# Patient Record
Sex: Female | Born: 1980
Health system: Southern US, Community
[De-identification: ages and names within clinical notes are randomized; demographics above are authoritative.]

## PROBLEM LIST (undated history)

## (undated) DIAGNOSIS — E079 Disorder of thyroid, unspecified: Secondary | ICD-10-CM

## (undated) HISTORY — DX: Disorder of thyroid, unspecified: E07.9

---

## 2005-05-20 ENCOUNTER — Other Ambulatory Visit: Admission: RE | Admit: 2005-05-20 | Discharge: 2005-05-20 | Payer: Self-pay | Admitting: Obstetrics and Gynecology

## 2016-03-10 ENCOUNTER — Ambulatory Visit (INDEPENDENT_AMBULATORY_CARE_PROVIDER_SITE_OTHER): Payer: BLUE CROSS/BLUE SHIELD | Admitting: Podiatry

## 2016-03-10 ENCOUNTER — Ambulatory Visit (INDEPENDENT_AMBULATORY_CARE_PROVIDER_SITE_OTHER): Payer: BLUE CROSS/BLUE SHIELD

## 2016-03-10 ENCOUNTER — Encounter: Payer: Self-pay | Admitting: Podiatry

## 2016-03-10 DIAGNOSIS — R52 Pain, unspecified: Secondary | ICD-10-CM

## 2016-03-10 DIAGNOSIS — M722 Plantar fascial fibromatosis: Secondary | ICD-10-CM

## 2016-03-10 MED ORDER — MELOXICAM 15 MG PO TABS: 15.0000 mg | ORAL_TABLET | Freq: Every day | ORAL | 2 refills | Status: DC

## 2016-03-10 NOTE — Progress Notes (Signed)
   Subjective:    Patient ID: Carrie Taylor, female    DOB: [birth year], 35 y.o.   MRN: [id number]  HPI  35 year old female presents the office they for concerns of right heel pain which is been ongoing for about 2 weeks. She states that she has pain in the morning and she prescribed up after sitting for some time but she is getting some pain in the day and with exercising. She states it hurts on the bottom of the heel describes as a throbbing sensation. No recent injury or trauma. No swelling or redness. No numbness or tingling. Pain does not wake up at night. No other complaints at this time. No treatment.    Review of Systems  All other systems reviewed and are negative.      Objective:   Physical Exam General: AAO x3, NAD  Dermatological: Skin is warm, dry and supple bilateral. Nails x 10 are well manicured; remaining integument appears unremarkable at this time. There are no open sores, no preulcerative lesions, no rash or signs of infection present.  Vascular: Dorsalis Pedis artery and Posterior Tibial artery pedal pulses are 2/4 bilateral with immedate capillary fill time. There is no pain with calf compression, swelling, warmth, erythema.   Neruologic: Grossly intact via Taylor touch bilateral. Vibratory intact via tuning fork bilateral. Protective threshold with Semmes Wienstein monofilament intact to all pedal sites bilateral.    Musculoskeletal: Tenderness to palpation along the plantar medial tubercle of the calcaneus at the insertion of plantar fascia on the right foot. There is no pain along the course of the plantar fascia within the arch of the foot. Plantar fascia appears to be intact. There is no pain with lateral compression of the calcaneus or pain with vibratory sensation. There is mild pain along the course or insertion of the achilles tendon. Thompson test is negative. No other areas of tenderness to bilateral lower extremities. Muscular strength 5/5 in all groups tested  bilateral.  Gait: Unassisted, Nonantalgic.     Assessment & Plan:  35 year old female right heel pain, likely plantar fasciitis with Achilles tendinitis. -Treatment options discussed including all alternatives, risks, and complications -Etiology of symptoms were discussed -X-rays were obtained and reviewed with the patient. No evidence of acute fracture identified. -Patient elects to proceed with steroid injection into the right heel. Under sterile skin preparation, a total of 2.5cc of kenalog 10, 0.5% Marcaine plain, and 2% lidocaine plain were infiltrated into the symptomatic area without complication. A band-aid was applied. Patient tolerated the injection well without complication. Post-injection care with discussed with the patient. Discussed with the patient to ice the area over the next couple of days to help prevent a steroid flare.  -Plantar fascial brace dispensed -Prescribed mobic. Discussed side effects of the medication and directed to stop if any are to occur and call the office.  -Stretching and icing exercises daily -Discussed shoe gear modifications and orthotics -Follow-up in 3 weeks or sooner if any problems arise. In the meantime, encouraged to call the office with any questions, concerns, change in symptoms.   Ovid CurdMatthew Wagoner, DPM

## 2016-03-10 NOTE — Patient Instructions (Signed)

## 2016-03-31 ENCOUNTER — Encounter: Payer: Self-pay | Admitting: Podiatry

## 2016-03-31 ENCOUNTER — Ambulatory Visit (INDEPENDENT_AMBULATORY_CARE_PROVIDER_SITE_OTHER): Payer: BLUE CROSS/BLUE SHIELD | Admitting: Podiatry

## 2016-03-31 DIAGNOSIS — M722 Plantar fascial fibromatosis: Secondary | ICD-10-CM

## 2016-03-31 NOTE — Progress Notes (Signed)
Subjective: Carrie Taylor presents to the office today for follow-up evaluation of right heel pain. At that she is doing better but she still having pain in her right heel and the morning when she gets up and after exercise. Denies any swelling or redness. She has been wearing the plantar fascial brace. She has been stretching and icing. No numbness or tingling. No other complaints at this time. No acute changes since last appointment. They deny any systemic complaints such as fevers, chills, nausea, vomiting.  Objective: General: AAO x3, NAD  Dermatological: Skin is warm, dry and supple bilateral. Nails x 10 are well manicured; remaining integument appears unremarkable at this time. There are no open sores, no preulcerative lesions, no rash or signs of infection present.  Vascular: Dorsalis Pedis artery and Posterior Tibial artery pedal pulses are 2/4 bilateral with immedate capillary fill time. Pedal hair growth present. There is no pain with calf compression, swelling, warmth, erythema.   Neruologic: Grossly intact via Taylor touch bilateral. Vibratory intact via tuning fork bilateral. Protective threshold with Semmes Wienstein monofilament intact to all pedal sites bilateral.   Musculoskeletal: There is improved but continued tenderness palpation along the plantar medial tubercle of the calcaneus at the insertion of the plantar fascia on the right foot. There is no pain along the course of the plantar fascia within the arch of the foot. Plantar fascia appears to be intact bilaterally. There is no pain with lateral compression of the calcaneus and there is no pain with vibratory sensation. There is no pain along the course or insertion of the Achilles tendon. There are no other areas of tenderness to bilateral lower extremities. No gross boney pedal deformities bilateral. No pain, crepitus, or limitation noted with foot and ankle range of motion bilateral. Muscular strength 5/5 in all groups tested  bilateral.  Gait: Unassisted, Nonantalgic.   Assessment: Presents for follow-up evaluation for heel pain, likely plantar fasciitis   Plan: -Treatment options discussed including all alternatives, risks, and complications -Patient elects to proceed with steroid injection into the right heel. Under sterile skin preparation, a total of 2.5cc of kenalog 10, 0.5% Marcaine plain, and 2% lidocaine plain were infiltrated into the symptomatic area without complication. A band-aid was applied. Patient tolerated the injection well without complication. Post-injection care with discussed with the patient. Discussed with the patient to ice the area over the next couple of days to help prevent a steroid flare.  -Discussed orthotics. She is going to start with an over-the-counter insert. -Dispensed night splint -Ice and stretching exercises on a daily basis. -Continue supportive shoe gear. -Follow-up in 3 weeks or sooner if any problems arise. In the meantime, encouraged to call the office with any questions, concerns, change in symptoms.   Ovid CurdMatthew Ryelle Ruvalcaba, DPM

## 2016-04-11 ENCOUNTER — Telehealth: Payer: Self-pay | Admitting: *Deleted

## 2016-04-11 MED ORDER — MELOXICAM 15 MG PO TABS: 15.0000 mg | ORAL_TABLET | Freq: Every day | ORAL | 0 refills | Status: DC

## 2016-04-11 NOTE — Telephone Encounter (Signed)
Received request for #90 supply of Meloxicam.  Dr. Bary CastillaWagoner okayed the prescription to be changed to #90 without refills. Return faxed to [phone].

## 2016-04-28 ENCOUNTER — Ambulatory Visit: Payer: BLUE CROSS/BLUE SHIELD | Admitting: Podiatry

## 2016-07-30 ENCOUNTER — Other Ambulatory Visit: Payer: Self-pay | Admitting: Podiatry

## 2016-08-01 NOTE — Telephone Encounter (Signed)
Pt needs a follow up appt, prior to future refills. 

## 2016-08-09 ENCOUNTER — Ambulatory Visit (INDEPENDENT_AMBULATORY_CARE_PROVIDER_SITE_OTHER): Payer: BLUE CROSS/BLUE SHIELD | Admitting: Podiatry

## 2016-08-09 DIAGNOSIS — M79671 Pain in right foot: Secondary | ICD-10-CM

## 2016-08-09 DIAGNOSIS — M722 Plantar fascial fibromatosis: Secondary | ICD-10-CM

## 2016-08-09 MED ORDER — METHYLPREDNISOLONE 4 MG PO TBPK: ORAL_TABLET | ORAL | 0 refills | Status: DC

## 2016-08-09 MED ORDER — MELOXICAM 15 MG PO TABS: 15.0000 mg | ORAL_TABLET | Freq: Every day | ORAL | 1 refills | Status: AC

## 2016-08-09 NOTE — Progress Notes (Signed)
   Subjective: Patient presents today for recurrent plantar fasciitis to the right foot 2 weeks. Patient states that she had a flareup approximately 2 weeks ago. She denies change in daily activities. Patient exercises on a regular basis. Patient states that is painful to walk. Patient was last seen 03/31/2016 under the care of Dr. Jacqualyn Posey for plantar fasciitis to the same right foot. With this latest injury, the patient notices a skin crease in the bottom of her right heel.  Objective: Physical Exam General: The patient is alert and oriented x3 in no acute distress.  Dermatology: Skin is warm, dry and supple bilateral lower extremities. Negative for open lesions or macerations bilateral.   Vascular: Dorsalis Pedis and Posterior Tibial pulses palpable bilateral.  Capillary fill time is immediate to all digits.  Neurological: Epicritic and protective threshold intact bilateral.   Musculoskeletal: Tenderness to palpation at the medial calcaneal tubercale and through the insertion of the plantar fascia of the right foot. All other joints range of motion within normal limits bilateral. Strength 5/5 in all groups bilateral.    Assessment: 1. Plantar fasciitis right 2. Pain in right foot  Plan of Care:  1. Patient evaluated. Xrays reviewed.   2. Patient opts not to have an anti-inflammatory injection. She states that her insurance will not pay for it until her deductible is met. 3. Prescription for Medrol Dosepak 4. Prescription for meloxicam 5. Return to clinic in 4 weeks 6. Continue plantar fascial band in good supportive shoe gear and stretching.  Edrick Kins, DPM [facility]  Dr. Edrick Kins, [address]                                        [place], [place] [address]                Office [phone]  Fax [phone]

## 2016-09-06 ENCOUNTER — Ambulatory Visit: Payer: BLUE CROSS/BLUE SHIELD | Admitting: Podiatry

## 2016-09-27 ENCOUNTER — Ambulatory Visit (INDEPENDENT_AMBULATORY_CARE_PROVIDER_SITE_OTHER): Payer: BLUE CROSS/BLUE SHIELD | Admitting: Podiatry

## 2016-09-27 DIAGNOSIS — S93691D Other sprain of right foot, subsequent encounter: Secondary | ICD-10-CM

## 2016-09-27 DIAGNOSIS — M722 Plantar fascial fibromatosis: Secondary | ICD-10-CM

## 2016-09-27 DIAGNOSIS — M79671 Pain in right foot: Secondary | ICD-10-CM | POA: Diagnosis not present

## 2016-09-27 NOTE — Progress Notes (Signed)
   Subjective: 36 year old female presents today for follow-up evaluation of plantar fasciitis with possible plantar fascial rupture of the right foot. Patient states that she is improving in the Medrol Dosepak helped. However she still has some residual pain. She also states that the skin crease in the bottom of her right heel has done or prominent larger. Patient has had a history of this plantar fasciitis for greater than 6 months now with increased aggravation on the latest episode. Patient does active CrossFit  Objective: Physical Exam General: The patient is alert and oriented x3 in no acute distress.  Dermatology: Skin is warm, dry and supple bilateral lower extremities. Negative for open lesions or macerations bilateral.   Vascular: Dorsalis Pedis and Posterior Tibial pulses palpable bilateral.  Capillary fill time is immediate to all digits.  Neurological: Epicritic and protective threshold intact bilateral.   Musculoskeletal: Tenderness to palpation at the medial calcaneal tubercale and through the insertion of the plantar fascia of the right foot. All other joints range of motion within normal limits bilateral. Strength 5/5 in all groups bilateral.    Assessment: 1. Plantar fasciitis right 2. Possible plantar fascial rupture right heel  Plan of Care:  1. Patient evaluated. Xrays reviewed.   2. Today were going to order an MRI for possible plantar fascial rupture to determine if surgical intervention is warranted. Patient also has the history of plantar fasciitis for greater than 6 months. Patient has been dealing with the plantar fasciitis since fall of last year. 3. Compression anklet dispensed 4. Today were going to immobilize the right foot in a cam boot. Cam boot dispensed 5. Return to clinic in 4 weeks   Felecia ShellingBrent M. Eldra Word, DPM [facility]  Dr. Felecia ShellingBrent M. Kimanh Templeman, DPM    [address]                                        [place], [place] [address]                 Office [phone]  Fax [phone]

## 2016-09-29 ENCOUNTER — Telehealth: Payer: Self-pay

## 2016-09-29 NOTE — Addendum Note (Signed)
Addended by: Geraldine ContrasVENABLE, ANGELA D on: 09/29/2016 09:18 AM   Modules accepted: Orders

## 2016-09-29 NOTE — Telephone Encounter (Signed)
Prior Authorization approved per Inetta Fermoina with [employer] ([employer])  Auth # [id number]  Good until 10/14/16.  Radiology scheduling has been notified and they will call patient to schedule.

## 2016-10-07 ENCOUNTER — Encounter: Payer: Self-pay | Admitting: Radiology

## 2016-10-07 ENCOUNTER — Ambulatory Visit
Admission: RE | Admit: 2016-10-07 | Discharge: 2016-10-07 | Disposition: A | Discharge status: Home / Self Care | Payer: BLUE CROSS/BLUE SHIELD | Source: Ambulatory Visit | Attending: Podiatry | Admitting: Podiatry

## 2016-10-07 DIAGNOSIS — M67471 Ganglion, right ankle and foot: Secondary | ICD-10-CM | POA: Insufficient documentation

## 2016-10-07 DIAGNOSIS — X58XXXD Exposure to other specified factors, subsequent encounter: Secondary | ICD-10-CM | POA: Insufficient documentation

## 2016-10-07 DIAGNOSIS — S93691D Other sprain of right foot, subsequent encounter: Secondary | ICD-10-CM | POA: Diagnosis not present

## 2016-10-13 ENCOUNTER — Telehealth: Payer: Self-pay | Admitting: *Deleted

## 2016-10-13 NOTE — Telephone Encounter (Signed)
Pt called to see if MRI results were in. I reviewed the results and informed pt the results were in and she should remain in the walking boot, and transferred to see if she could get an earlier appt.

## 2016-10-14 NOTE — Telephone Encounter (Signed)
Perfect. Thanks, Dr. Evans

## 2016-10-21 ENCOUNTER — Ambulatory Visit (INDEPENDENT_AMBULATORY_CARE_PROVIDER_SITE_OTHER): Payer: BLUE CROSS/BLUE SHIELD | Admitting: Podiatry

## 2016-10-21 DIAGNOSIS — S93691D Other sprain of right foot, subsequent encounter: Secondary | ICD-10-CM

## 2016-10-21 DIAGNOSIS — M79671 Pain in right foot: Secondary | ICD-10-CM

## 2016-10-23 NOTE — Progress Notes (Signed)
   Subjective: 36 year old female presents today for follow-up evaluation of right foot pain. Patient states that she has a little better, however she is still experiencing some pain in the foot. She has been taking the meloxicam as directed and states it is helping. Patient does active CrossFit  Objective: Physical Exam General: The patient is alert and oriented x3 in no acute distress.  Dermatology: Skin is warm, dry and supple bilateral lower extremities. Negative for open lesions or macerations bilateral.   Vascular: Dorsalis Pedis and Posterior Tibial pulses palpable bilateral.  Capillary fill time is immediate to all digits.  Neurological: Epicritic and protective threshold intact bilateral.   Musculoskeletal: Tenderness to palpation at the medial calcaneal tubercale and through the insertion of the plantar fascia of the right foot. All other joints range of motion within normal limits bilateral. Strength 5/5 in all groups bilateral.   MRI Impression: Tear of the proximal flexor digitorum brevis muscle just deep to the proximal medial band of the plantar fascia. Strain at the adjacent abductor digiti minimi muscle. The plantar fascia is intact.  Assessment: 1. Flexor digitorum brevis muscle belly tear-right  Plan of Care:  1. Patient evaluated.  2. Transition from CAM boot back into activity. 3. Return to clinic when necessary.  Felecia Shelling, DPM [facility]  Dr. Felecia Shelling, DPM    [address]                                        [place], [place] [address]                Office [phone]  Fax [phone]

## 2016-10-25 ENCOUNTER — Ambulatory Visit: Payer: BLUE CROSS/BLUE SHIELD | Admitting: Podiatry

## 2016-12-06 ENCOUNTER — Other Ambulatory Visit: Payer: Self-pay

## 2016-12-06 MED ORDER — MELOXICAM 15 MG PO TABS: 15.0000 mg | ORAL_TABLET | Freq: Every day | ORAL | 0 refills | Status: DC

## 2019-08-30 DIAGNOSIS — Z113 Encounter for screening for infections with a predominantly sexual mode of transmission: Secondary | ICD-10-CM | POA: Diagnosis not present

## 2019-08-30 DIAGNOSIS — Z01419 Encounter for gynecological examination (general) (routine) without abnormal findings: Secondary | ICD-10-CM | POA: Diagnosis not present

## 2019-08-30 DIAGNOSIS — Z6820 Body mass index (BMI) 20.0-20.9, adult: Secondary | ICD-10-CM | POA: Diagnosis not present

## 2019-08-30 DIAGNOSIS — Z3040 Encounter for surveillance of contraceptives, unspecified: Secondary | ICD-10-CM | POA: Diagnosis not present

## 2019-10-19 ENCOUNTER — Ambulatory Visit: Payer: BLUE CROSS/BLUE SHIELD

## 2019-11-13 ENCOUNTER — Ambulatory Visit: Payer: Self-pay | Attending: Internal Medicine

## 2019-11-13 ENCOUNTER — Other Ambulatory Visit: Payer: Self-pay

## 2019-11-13 DIAGNOSIS — Z23 Encounter for immunization: Secondary | ICD-10-CM

## 2019-11-13 NOTE — Progress Notes (Signed)
   Covid-19 Vaccination Clinic  Name:  Carrie Taylor    MRN: [id number] DOB: 1980/11/03  11/13/2019  Carrie Taylor was observed post Covid-19 immunization for 15 minutes without incident. She was provided with Vaccine Information Sheet and instruction to access the V-Safe system.   Carrie Taylor was instructed to call 911 with any severe reactions post vaccine: Marland Kitchen Difficulty breathing  . Swelling of face and throat  . A fast heartbeat  . A bad rash all over body  . Dizziness and weakness   Immunizations Administered    Name Date Dose VIS Date Route   Pfizer COVID-19 Vaccine 11/13/2019  8:23 AM 0.3 mL 08/28/2018 Intramuscular   Manufacturer: [employer], [employer]   Lot: [id number]   NDC: [address]-1

## 2019-12-06 ENCOUNTER — Ambulatory Visit: Payer: Self-pay | Attending: Internal Medicine

## 2019-12-06 DIAGNOSIS — Z23 Encounter for immunization: Secondary | ICD-10-CM

## 2019-12-06 NOTE — Progress Notes (Signed)
   Covid-19 Vaccination Clinic  Name:  Carrie Taylor    MRN: [id number] DOB: [birth year]  12/06/2019  Carrie Taylor was observed post Covid-19 immunization for 15 minutes without incident. She was provided with Vaccine Information Sheet and instruction to access the V-Safe system.   Carrie Taylor was instructed to call 911 with any severe reactions post vaccine: Marland Kitchen Difficulty breathing  . Swelling of face and throat  . A fast heartbeat  . A bad rash all over body  . Dizziness and weakness   Immunizations Administered    Name Date Dose VIS Date Route   Pfizer COVID-19 Vaccine 12/06/2019  8:01 AM 0.3 mL 08/28/2018 Intramuscular   Manufacturer: [employer], [employer]   Lot: [id number]   NDC: [address]-9

## 2020-01-09 DIAGNOSIS — Z0189 Encounter for other specified special examinations: Secondary | ICD-10-CM | POA: Diagnosis not present

## 2020-01-10 DIAGNOSIS — Z043 Encounter for examination and observation following other accident: Secondary | ICD-10-CM | POA: Diagnosis not present

## 2020-01-10 DIAGNOSIS — Z713 Dietary counseling and surveillance: Secondary | ICD-10-CM | POA: Diagnosis not present

## 2020-04-09 DIAGNOSIS — Z23 Encounter for immunization: Secondary | ICD-10-CM | POA: Diagnosis not present

## 2020-05-20 DIAGNOSIS — S76311A Strain of muscle, fascia and tendon of the posterior muscle group at thigh level, right thigh, initial encounter: Secondary | ICD-10-CM | POA: Diagnosis not present

## 2020-05-20 DIAGNOSIS — M7061 Trochanteric bursitis, right hip: Secondary | ICD-10-CM | POA: Diagnosis not present

## 2020-06-04 DIAGNOSIS — S76311D Strain of muscle, fascia and tendon of the posterior muscle group at thigh level, right thigh, subsequent encounter: Secondary | ICD-10-CM | POA: Diagnosis not present

## 2020-06-09 DIAGNOSIS — S76311D Strain of muscle, fascia and tendon of the posterior muscle group at thigh level, right thigh, subsequent encounter: Secondary | ICD-10-CM | POA: Diagnosis not present

## 2020-06-11 DIAGNOSIS — S76311D Strain of muscle, fascia and tendon of the posterior muscle group at thigh level, right thigh, subsequent encounter: Secondary | ICD-10-CM | POA: Diagnosis not present

## 2020-06-17 DIAGNOSIS — S76311D Strain of muscle, fascia and tendon of the posterior muscle group at thigh level, right thigh, subsequent encounter: Secondary | ICD-10-CM | POA: Diagnosis not present

## 2020-06-19 DIAGNOSIS — S76311D Strain of muscle, fascia and tendon of the posterior muscle group at thigh level, right thigh, subsequent encounter: Secondary | ICD-10-CM | POA: Diagnosis not present

## 2020-06-23 DIAGNOSIS — S76311D Strain of muscle, fascia and tendon of the posterior muscle group at thigh level, right thigh, subsequent encounter: Secondary | ICD-10-CM | POA: Diagnosis not present

## 2020-06-25 DIAGNOSIS — S76311D Strain of muscle, fascia and tendon of the posterior muscle group at thigh level, right thigh, subsequent encounter: Secondary | ICD-10-CM | POA: Diagnosis not present

## 2020-07-07 DIAGNOSIS — S76311D Strain of muscle, fascia and tendon of the posterior muscle group at thigh level, right thigh, subsequent encounter: Secondary | ICD-10-CM | POA: Diagnosis not present

## 2020-07-13 DIAGNOSIS — M9903 Segmental and somatic dysfunction of lumbar region: Secondary | ICD-10-CM | POA: Diagnosis not present

## 2020-07-13 DIAGNOSIS — M5416 Radiculopathy, lumbar region: Secondary | ICD-10-CM | POA: Diagnosis not present

## 2020-07-13 DIAGNOSIS — M62830 Muscle spasm of back: Secondary | ICD-10-CM | POA: Diagnosis not present

## 2020-07-13 DIAGNOSIS — M9905 Segmental and somatic dysfunction of pelvic region: Secondary | ICD-10-CM | POA: Diagnosis not present

## 2020-07-14 DIAGNOSIS — M9905 Segmental and somatic dysfunction of pelvic region: Secondary | ICD-10-CM | POA: Diagnosis not present

## 2020-07-14 DIAGNOSIS — S76311D Strain of muscle, fascia and tendon of the posterior muscle group at thigh level, right thigh, subsequent encounter: Secondary | ICD-10-CM | POA: Diagnosis not present

## 2020-07-14 DIAGNOSIS — M5416 Radiculopathy, lumbar region: Secondary | ICD-10-CM | POA: Diagnosis not present

## 2020-07-14 DIAGNOSIS — M62830 Muscle spasm of back: Secondary | ICD-10-CM | POA: Diagnosis not present

## 2020-07-14 DIAGNOSIS — M9903 Segmental and somatic dysfunction of lumbar region: Secondary | ICD-10-CM | POA: Diagnosis not present

## 2020-07-15 DIAGNOSIS — M5416 Radiculopathy, lumbar region: Secondary | ICD-10-CM | POA: Diagnosis not present

## 2020-07-15 DIAGNOSIS — M9905 Segmental and somatic dysfunction of pelvic region: Secondary | ICD-10-CM | POA: Diagnosis not present

## 2020-07-15 DIAGNOSIS — M9903 Segmental and somatic dysfunction of lumbar region: Secondary | ICD-10-CM | POA: Diagnosis not present

## 2020-07-15 DIAGNOSIS — S76311D Strain of muscle, fascia and tendon of the posterior muscle group at thigh level, right thigh, subsequent encounter: Secondary | ICD-10-CM | POA: Diagnosis not present

## 2020-07-15 DIAGNOSIS — M62830 Muscle spasm of back: Secondary | ICD-10-CM | POA: Diagnosis not present

## 2020-07-17 DIAGNOSIS — M9903 Segmental and somatic dysfunction of lumbar region: Secondary | ICD-10-CM | POA: Diagnosis not present

## 2020-07-17 DIAGNOSIS — M9905 Segmental and somatic dysfunction of pelvic region: Secondary | ICD-10-CM | POA: Diagnosis not present

## 2020-07-17 DIAGNOSIS — M62830 Muscle spasm of back: Secondary | ICD-10-CM | POA: Diagnosis not present

## 2020-07-17 DIAGNOSIS — M5416 Radiculopathy, lumbar region: Secondary | ICD-10-CM | POA: Diagnosis not present

## 2020-07-21 DIAGNOSIS — M9905 Segmental and somatic dysfunction of pelvic region: Secondary | ICD-10-CM | POA: Diagnosis not present

## 2020-07-21 DIAGNOSIS — M9903 Segmental and somatic dysfunction of lumbar region: Secondary | ICD-10-CM | POA: Diagnosis not present

## 2020-07-21 DIAGNOSIS — M62830 Muscle spasm of back: Secondary | ICD-10-CM | POA: Diagnosis not present

## 2020-07-21 DIAGNOSIS — M5416 Radiculopathy, lumbar region: Secondary | ICD-10-CM | POA: Diagnosis not present

## 2020-07-22 DIAGNOSIS — M62830 Muscle spasm of back: Secondary | ICD-10-CM | POA: Diagnosis not present

## 2020-07-22 DIAGNOSIS — M9905 Segmental and somatic dysfunction of pelvic region: Secondary | ICD-10-CM | POA: Diagnosis not present

## 2020-07-22 DIAGNOSIS — M5416 Radiculopathy, lumbar region: Secondary | ICD-10-CM | POA: Diagnosis not present

## 2020-07-22 DIAGNOSIS — M9903 Segmental and somatic dysfunction of lumbar region: Secondary | ICD-10-CM | POA: Diagnosis not present

## 2020-07-23 DIAGNOSIS — M5416 Radiculopathy, lumbar region: Secondary | ICD-10-CM | POA: Diagnosis not present

## 2020-07-23 DIAGNOSIS — M9905 Segmental and somatic dysfunction of pelvic region: Secondary | ICD-10-CM | POA: Diagnosis not present

## 2020-07-23 DIAGNOSIS — M9903 Segmental and somatic dysfunction of lumbar region: Secondary | ICD-10-CM | POA: Diagnosis not present

## 2020-07-23 DIAGNOSIS — M62830 Muscle spasm of back: Secondary | ICD-10-CM | POA: Diagnosis not present

## 2020-07-27 DIAGNOSIS — M5416 Radiculopathy, lumbar region: Secondary | ICD-10-CM | POA: Diagnosis not present

## 2020-07-27 DIAGNOSIS — M9903 Segmental and somatic dysfunction of lumbar region: Secondary | ICD-10-CM | POA: Diagnosis not present

## 2020-07-27 DIAGNOSIS — M9905 Segmental and somatic dysfunction of pelvic region: Secondary | ICD-10-CM | POA: Diagnosis not present

## 2020-07-27 DIAGNOSIS — M62830 Muscle spasm of back: Secondary | ICD-10-CM | POA: Diagnosis not present

## 2020-07-28 DIAGNOSIS — S76311D Strain of muscle, fascia and tendon of the posterior muscle group at thigh level, right thigh, subsequent encounter: Secondary | ICD-10-CM | POA: Diagnosis not present

## 2020-07-29 DIAGNOSIS — M9905 Segmental and somatic dysfunction of pelvic region: Secondary | ICD-10-CM | POA: Diagnosis not present

## 2020-07-29 DIAGNOSIS — M62830 Muscle spasm of back: Secondary | ICD-10-CM | POA: Diagnosis not present

## 2020-07-29 DIAGNOSIS — M9903 Segmental and somatic dysfunction of lumbar region: Secondary | ICD-10-CM | POA: Diagnosis not present

## 2020-07-29 DIAGNOSIS — M5416 Radiculopathy, lumbar region: Secondary | ICD-10-CM | POA: Diagnosis not present

## 2020-07-30 DIAGNOSIS — M62830 Muscle spasm of back: Secondary | ICD-10-CM | POA: Diagnosis not present

## 2020-07-30 DIAGNOSIS — M9905 Segmental and somatic dysfunction of pelvic region: Secondary | ICD-10-CM | POA: Diagnosis not present

## 2020-07-30 DIAGNOSIS — M9903 Segmental and somatic dysfunction of lumbar region: Secondary | ICD-10-CM | POA: Diagnosis not present

## 2020-07-30 DIAGNOSIS — M5416 Radiculopathy, lumbar region: Secondary | ICD-10-CM | POA: Diagnosis not present

## 2020-08-04 DIAGNOSIS — M62830 Muscle spasm of back: Secondary | ICD-10-CM | POA: Diagnosis not present

## 2020-08-04 DIAGNOSIS — M9905 Segmental and somatic dysfunction of pelvic region: Secondary | ICD-10-CM | POA: Diagnosis not present

## 2020-08-04 DIAGNOSIS — M5416 Radiculopathy, lumbar region: Secondary | ICD-10-CM | POA: Diagnosis not present

## 2020-08-04 DIAGNOSIS — M9903 Segmental and somatic dysfunction of lumbar region: Secondary | ICD-10-CM | POA: Diagnosis not present

## 2020-08-06 DIAGNOSIS — M62830 Muscle spasm of back: Secondary | ICD-10-CM | POA: Diagnosis not present

## 2020-08-06 DIAGNOSIS — M9903 Segmental and somatic dysfunction of lumbar region: Secondary | ICD-10-CM | POA: Diagnosis not present

## 2020-08-06 DIAGNOSIS — M9905 Segmental and somatic dysfunction of pelvic region: Secondary | ICD-10-CM | POA: Diagnosis not present

## 2020-08-06 DIAGNOSIS — M5416 Radiculopathy, lumbar region: Secondary | ICD-10-CM | POA: Diagnosis not present

## 2020-08-11 DIAGNOSIS — M9903 Segmental and somatic dysfunction of lumbar region: Secondary | ICD-10-CM | POA: Diagnosis not present

## 2020-08-11 DIAGNOSIS — M62830 Muscle spasm of back: Secondary | ICD-10-CM | POA: Diagnosis not present

## 2020-08-11 DIAGNOSIS — M9905 Segmental and somatic dysfunction of pelvic region: Secondary | ICD-10-CM | POA: Diagnosis not present

## 2020-08-11 DIAGNOSIS — M5416 Radiculopathy, lumbar region: Secondary | ICD-10-CM | POA: Diagnosis not present

## 2020-08-13 DIAGNOSIS — M9905 Segmental and somatic dysfunction of pelvic region: Secondary | ICD-10-CM | POA: Diagnosis not present

## 2020-08-13 DIAGNOSIS — M62830 Muscle spasm of back: Secondary | ICD-10-CM | POA: Diagnosis not present

## 2020-08-13 DIAGNOSIS — M9903 Segmental and somatic dysfunction of lumbar region: Secondary | ICD-10-CM | POA: Diagnosis not present

## 2020-08-13 DIAGNOSIS — M5416 Radiculopathy, lumbar region: Secondary | ICD-10-CM | POA: Diagnosis not present

## 2020-08-19 DIAGNOSIS — M62830 Muscle spasm of back: Secondary | ICD-10-CM | POA: Diagnosis not present

## 2020-08-19 DIAGNOSIS — M9903 Segmental and somatic dysfunction of lumbar region: Secondary | ICD-10-CM | POA: Diagnosis not present

## 2020-08-19 DIAGNOSIS — M9905 Segmental and somatic dysfunction of pelvic region: Secondary | ICD-10-CM | POA: Diagnosis not present

## 2020-08-19 DIAGNOSIS — M5416 Radiculopathy, lumbar region: Secondary | ICD-10-CM | POA: Diagnosis not present

## 2020-08-26 DIAGNOSIS — M9903 Segmental and somatic dysfunction of lumbar region: Secondary | ICD-10-CM | POA: Diagnosis not present

## 2020-08-26 DIAGNOSIS — M62830 Muscle spasm of back: Secondary | ICD-10-CM | POA: Diagnosis not present

## 2020-08-26 DIAGNOSIS — M5416 Radiculopathy, lumbar region: Secondary | ICD-10-CM | POA: Diagnosis not present

## 2020-08-26 DIAGNOSIS — M9905 Segmental and somatic dysfunction of pelvic region: Secondary | ICD-10-CM | POA: Diagnosis not present

## 2020-09-02 DIAGNOSIS — M9903 Segmental and somatic dysfunction of lumbar region: Secondary | ICD-10-CM | POA: Diagnosis not present

## 2020-09-02 DIAGNOSIS — M5416 Radiculopathy, lumbar region: Secondary | ICD-10-CM | POA: Diagnosis not present

## 2020-09-02 DIAGNOSIS — M9905 Segmental and somatic dysfunction of pelvic region: Secondary | ICD-10-CM | POA: Diagnosis not present

## 2020-09-02 DIAGNOSIS — M62830 Muscle spasm of back: Secondary | ICD-10-CM | POA: Diagnosis not present

## 2020-09-09 DIAGNOSIS — M9903 Segmental and somatic dysfunction of lumbar region: Secondary | ICD-10-CM | POA: Diagnosis not present

## 2020-09-09 DIAGNOSIS — M5416 Radiculopathy, lumbar region: Secondary | ICD-10-CM | POA: Diagnosis not present

## 2020-09-09 DIAGNOSIS — M9905 Segmental and somatic dysfunction of pelvic region: Secondary | ICD-10-CM | POA: Diagnosis not present

## 2020-09-09 DIAGNOSIS — M62830 Muscle spasm of back: Secondary | ICD-10-CM | POA: Diagnosis not present

## 2020-09-16 DIAGNOSIS — M62830 Muscle spasm of back: Secondary | ICD-10-CM | POA: Diagnosis not present

## 2020-09-16 DIAGNOSIS — M5416 Radiculopathy, lumbar region: Secondary | ICD-10-CM | POA: Diagnosis not present

## 2020-09-16 DIAGNOSIS — M9903 Segmental and somatic dysfunction of lumbar region: Secondary | ICD-10-CM | POA: Diagnosis not present

## 2020-09-16 DIAGNOSIS — M9905 Segmental and somatic dysfunction of pelvic region: Secondary | ICD-10-CM | POA: Diagnosis not present

## 2020-09-23 DIAGNOSIS — M9903 Segmental and somatic dysfunction of lumbar region: Secondary | ICD-10-CM | POA: Diagnosis not present

## 2020-09-23 DIAGNOSIS — M5416 Radiculopathy, lumbar region: Secondary | ICD-10-CM | POA: Diagnosis not present

## 2020-09-23 DIAGNOSIS — M62830 Muscle spasm of back: Secondary | ICD-10-CM | POA: Diagnosis not present

## 2020-09-23 DIAGNOSIS — M9905 Segmental and somatic dysfunction of pelvic region: Secondary | ICD-10-CM | POA: Diagnosis not present

## 2020-09-30 DIAGNOSIS — Z01419 Encounter for gynecological examination (general) (routine) without abnormal findings: Secondary | ICD-10-CM | POA: Diagnosis not present

## 2020-09-30 DIAGNOSIS — M62830 Muscle spasm of back: Secondary | ICD-10-CM | POA: Diagnosis not present

## 2020-09-30 DIAGNOSIS — M5416 Radiculopathy, lumbar region: Secondary | ICD-10-CM | POA: Diagnosis not present

## 2020-09-30 DIAGNOSIS — M9905 Segmental and somatic dysfunction of pelvic region: Secondary | ICD-10-CM | POA: Diagnosis not present

## 2020-09-30 DIAGNOSIS — Z3041 Encounter for surveillance of contraceptive pills: Secondary | ICD-10-CM | POA: Diagnosis not present

## 2020-09-30 DIAGNOSIS — M9903 Segmental and somatic dysfunction of lumbar region: Secondary | ICD-10-CM | POA: Diagnosis not present

## 2020-09-30 DIAGNOSIS — Z6821 Body mass index (BMI) 21.0-21.9, adult: Secondary | ICD-10-CM | POA: Diagnosis not present

## 2020-10-07 DIAGNOSIS — M9903 Segmental and somatic dysfunction of lumbar region: Secondary | ICD-10-CM | POA: Diagnosis not present

## 2020-10-07 DIAGNOSIS — M5416 Radiculopathy, lumbar region: Secondary | ICD-10-CM | POA: Diagnosis not present

## 2020-10-07 DIAGNOSIS — M62830 Muscle spasm of back: Secondary | ICD-10-CM | POA: Diagnosis not present

## 2020-10-07 DIAGNOSIS — M9905 Segmental and somatic dysfunction of pelvic region: Secondary | ICD-10-CM | POA: Diagnosis not present

## 2020-10-13 ENCOUNTER — Ambulatory Visit: Payer: BC Managed Care – PPO | Admitting: Podiatry

## 2020-10-14 DIAGNOSIS — M62830 Muscle spasm of back: Secondary | ICD-10-CM | POA: Diagnosis not present

## 2020-10-14 DIAGNOSIS — M9905 Segmental and somatic dysfunction of pelvic region: Secondary | ICD-10-CM | POA: Diagnosis not present

## 2020-10-14 DIAGNOSIS — M9903 Segmental and somatic dysfunction of lumbar region: Secondary | ICD-10-CM | POA: Diagnosis not present

## 2020-10-14 DIAGNOSIS — M5416 Radiculopathy, lumbar region: Secondary | ICD-10-CM | POA: Diagnosis not present

## 2020-10-19 ENCOUNTER — Ambulatory Visit (INDEPENDENT_AMBULATORY_CARE_PROVIDER_SITE_OTHER): Payer: BC Managed Care – PPO

## 2020-10-19 ENCOUNTER — Ambulatory Visit: Payer: BC Managed Care – PPO | Admitting: Podiatry

## 2020-10-19 ENCOUNTER — Encounter: Payer: Self-pay | Admitting: Podiatry

## 2020-10-19 ENCOUNTER — Other Ambulatory Visit: Payer: Self-pay

## 2020-10-19 DIAGNOSIS — M722 Plantar fascial fibromatosis: Secondary | ICD-10-CM

## 2020-10-19 DIAGNOSIS — S86891A Other injury of other muscle(s) and tendon(s) at lower leg level, right leg, initial encounter: Secondary | ICD-10-CM

## 2020-10-19 MED ORDER — METHYLPREDNISOLONE 4 MG PO TBPK: ORAL_TABLET | ORAL | 0 refills | Status: DC

## 2020-10-19 NOTE — Progress Notes (Signed)
  Subjective:  Patient ID: Carrie Taylor, female    DOB: 1980-12-22,  MRN: [id number]  Chief Complaint  Patient presents with  . Foot Pain    Patient presents today for flare up of plantar fasciitis right heel x 3-4 weeks.  She is training for a [place] relay cross country and she wants to discuss orthotics    40 y.o. female presents with the above complaint. History confirmed with patient.  Has had this previously and oral steroids and meloxicam were helpful.  Has been doing stretching exercises.  Insurance previously did not cover injection therapy for her  Objective:  Physical Exam: warm, good capillary refill, no trophic changes or ulcerative lesions, normal DP and PT pulses and normal sensory exam.  Right Foot: Pain on palpation of the plantar fascia at the insertion of calcaneus plantarly, she has mild pain along the medial tibia  Radiographs: X-ray of the right foot: no fracture, dislocation, swelling or degenerative changes noted Assessment:   1. Plantar fasciitis   2. Right medial tibial stress syndrome, initial encounter      Plan:  Patient was evaluated and treated and all questions answered.  Discussed the etiology and treatment options for plantar fasciitis including stretching, formal physical therapy, supportive shoegears such as a running shoe or sneaker, pre fabricated orthoses, injection therapy, and oral medications. We also discussed the role of surgical treatment of this for patients who do not improve after exhausting non-surgical treatment options.   Plantar Fasciitis -XR reviewed with patient -Educated patient on stretching and icing of the affected limb -Rx for medrol pack. Educated on use, risks, and benefits of the medication  -She has meloxicam and continue taking this -The medial tibial pain I think is likely shinsplints that she has of these improve if she runs more.  Do not suspect this is tibial stress fracture but if it worsens or does not improve  consider further imaging if necessary -I do think she would benefit from custom molded orthoses long-term.  She was casted for these today.  Goal be a longitudinal arch support with a heel cup for her running shoes.  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

## 2020-10-19 NOTE — Patient Instructions (Signed)

## 2020-10-20 DIAGNOSIS — R87612 Low grade squamous intraepithelial lesion on cytologic smear of cervix (LGSIL): Secondary | ICD-10-CM | POA: Diagnosis not present

## 2020-10-20 DIAGNOSIS — N870 Mild cervical dysplasia: Secondary | ICD-10-CM | POA: Diagnosis not present

## 2020-10-21 DIAGNOSIS — M5416 Radiculopathy, lumbar region: Secondary | ICD-10-CM | POA: Diagnosis not present

## 2020-10-21 DIAGNOSIS — M9903 Segmental and somatic dysfunction of lumbar region: Secondary | ICD-10-CM | POA: Diagnosis not present

## 2020-10-21 DIAGNOSIS — M62830 Muscle spasm of back: Secondary | ICD-10-CM | POA: Diagnosis not present

## 2020-10-21 DIAGNOSIS — M9905 Segmental and somatic dysfunction of pelvic region: Secondary | ICD-10-CM | POA: Diagnosis not present

## 2020-10-28 DIAGNOSIS — M9903 Segmental and somatic dysfunction of lumbar region: Secondary | ICD-10-CM | POA: Diagnosis not present

## 2020-10-28 DIAGNOSIS — M5416 Radiculopathy, lumbar region: Secondary | ICD-10-CM | POA: Diagnosis not present

## 2020-10-28 DIAGNOSIS — M9905 Segmental and somatic dysfunction of pelvic region: Secondary | ICD-10-CM | POA: Diagnosis not present

## 2020-10-28 DIAGNOSIS — M62830 Muscle spasm of back: Secondary | ICD-10-CM | POA: Diagnosis not present

## 2020-11-04 DIAGNOSIS — M9903 Segmental and somatic dysfunction of lumbar region: Secondary | ICD-10-CM | POA: Diagnosis not present

## 2020-11-04 DIAGNOSIS — M5416 Radiculopathy, lumbar region: Secondary | ICD-10-CM | POA: Diagnosis not present

## 2020-11-04 DIAGNOSIS — M62830 Muscle spasm of back: Secondary | ICD-10-CM | POA: Diagnosis not present

## 2020-11-04 DIAGNOSIS — M9905 Segmental and somatic dysfunction of pelvic region: Secondary | ICD-10-CM | POA: Diagnosis not present

## 2020-11-11 ENCOUNTER — Ambulatory Visit (HOSPITAL_BASED_OUTPATIENT_CLINIC_OR_DEPARTMENT_OTHER): Payer: BC Managed Care – PPO | Admitting: Family Medicine

## 2020-11-11 ENCOUNTER — Ambulatory Visit (HOSPITAL_BASED_OUTPATIENT_CLINIC_OR_DEPARTMENT_OTHER): Payer: Self-pay | Admitting: Family Medicine

## 2020-11-11 ENCOUNTER — Encounter (HOSPITAL_BASED_OUTPATIENT_CLINIC_OR_DEPARTMENT_OTHER): Payer: Self-pay | Admitting: Family Medicine

## 2020-11-11 ENCOUNTER — Other Ambulatory Visit: Payer: Self-pay

## 2020-11-11 DIAGNOSIS — S86899A Other injury of other muscle(s) and tendon(s) at lower leg level, unspecified leg, initial encounter: Secondary | ICD-10-CM | POA: Diagnosis not present

## 2020-11-11 DIAGNOSIS — M76821 Posterior tibial tendinitis, right leg: Secondary | ICD-10-CM | POA: Diagnosis not present

## 2020-11-11 DIAGNOSIS — E041 Nontoxic single thyroid nodule: Secondary | ICD-10-CM

## 2020-11-11 NOTE — Patient Instructions (Addendum)
  Medication Instructions:  Your physician recommends that you continue on your current medications as directed. Please refer to the Current Medication list given to you today. --If you need a refill on any your medications before your next appointment, please call your pharmacy first. If no refills are authorized on file call the office.--  Referrals/Procedures/Imaging: A referral has been placed for you to go to Outpatient Physical Therapy at [facility]. Someone from the scheduling department will be in contact with you in regards to coordinating your consultation. If you do not hear from any of the schedulers within 7-10 business days please give our office a call.  [place] [facility] at [facility] on the 1st Floor Hours of Operation: Monday - Friday 8:00 am - 5:00 pm Closed on weekends and all major holidays [phone]   A referral has been placed for you to Endocrinology for evaluation and treatment. Someone from the scheduling department will be in contact with you in regards to coordinating your consultation. If you do not hear from any of the schedulers within 7-10 business days please give our office a call.  Follow-Up: Your next appointment:   Your physician recommends that you schedule a follow-up appointment As needed with Dr. de Peru  Thanks for letting us be apart of your health journey!!  Primary Care and Sports Medicine   Dr. de Peru and Shawna Clamp, DNP, AGNP  We recommend signing up for the patient portal called "MyChart".  Sign up information is provided on this After Visit Summary.  MyChart is used to connect with patients for Virtual Visits (Telemedicine).  Patients are able to view lab/test results, encounter notes, upcoming appointments, etc.  Non-urgent messages can be sent to your provider as well.   To learn more about what you can do with MyChart, please visit --  [email or web address].

## 2020-11-12 DIAGNOSIS — M9905 Segmental and somatic dysfunction of pelvic region: Secondary | ICD-10-CM | POA: Diagnosis not present

## 2020-11-12 DIAGNOSIS — M5416 Radiculopathy, lumbar region: Secondary | ICD-10-CM | POA: Diagnosis not present

## 2020-11-12 DIAGNOSIS — M62830 Muscle spasm of back: Secondary | ICD-10-CM | POA: Diagnosis not present

## 2020-11-12 DIAGNOSIS — M9903 Segmental and somatic dysfunction of lumbar region: Secondary | ICD-10-CM | POA: Diagnosis not present

## 2020-11-19 DIAGNOSIS — M9905 Segmental and somatic dysfunction of pelvic region: Secondary | ICD-10-CM | POA: Diagnosis not present

## 2020-11-19 DIAGNOSIS — M9903 Segmental and somatic dysfunction of lumbar region: Secondary | ICD-10-CM | POA: Diagnosis not present

## 2020-11-19 DIAGNOSIS — M5416 Radiculopathy, lumbar region: Secondary | ICD-10-CM | POA: Diagnosis not present

## 2020-11-19 DIAGNOSIS — M62830 Muscle spasm of back: Secondary | ICD-10-CM | POA: Diagnosis not present

## 2020-11-23 ENCOUNTER — Encounter (HOSPITAL_BASED_OUTPATIENT_CLINIC_OR_DEPARTMENT_OTHER): Payer: Self-pay | Admitting: Family Medicine

## 2020-11-23 NOTE — Assessment & Plan Note (Signed)
Will refer to endocrinology for evaluation of follow-up

## 2020-11-23 NOTE — Assessment & Plan Note (Signed)
Discussed diagnosis, treatment options and prognosis Ideally, would allow for period of relative rest and rehab, however given that Race is less than 1 month away it will be difficult to provide adequate rest Recommend completing some crosstraining to allow for maintenance of aerobic fitness while reducing symptoms Will refer to physical therapy for further evaluation and management, home exercise program as per PT Plan for follow-up shortly after race to monitor progress, adjust treatment plan as needed

## 2020-11-23 NOTE — Assessment & Plan Note (Addendum)
Suspect MTSS, less likely stress reaction or stress fracture given physical exam findings Ideally would allow for relative rest and rehab Plan and treatment similar to above Consider x-rays if notable worsening of symptoms or no improvement with relative rest

## 2020-11-23 NOTE — Progress Notes (Signed)
New Patient Office Visit  Subjective:  Patient ID: Carrie Taylor, female    DOB: 1980/10/28  Age: 40 y.o. MRN: [id number]  CC:  Chief Complaint  Patient presents with  . Leg Swelling    Patient states she is training for an event for the last 6 months and she notices continually pain and swelling in both calves starting from the heel of the foot shooting upward through her ankles for the last month    HPI Carrie Taylor is a 40 year old female presenting to establish in clinic.  She has current concerns today related to bilateral leg swelling and pain related to exertion.  She reports past medical history of thyroid nodule.  Bilateral leg pain and swelling: Patient has been training for upcoming charity run for multiple sclerosis.  Race will involve running 165 miles over the span of 6 days; this event is about 1 month away.  She is currently running about 60 miles per week.  Pain started about 1 month ago.  Pain will come and go while running, more noticeable after running.  Pain is worse in right distal lower extremity.  Has had some recent swelling along medial aspect of bilateral distal lower extremities.  Denies any numbness or tingling, no prior injuries or issues with legs.  Has not completed any imaging.  Has been trying ibuprofen and meloxicam without significant relief.  Past Medical History:  Diagnosis Date  . Thyroid disease     History reviewed. No pertinent surgical history.  Family History  Problem Relation Age of Onset  . Stroke Paternal Grandmother   . Colon cancer Paternal Grandmother     Social History   Socioeconomic History  . Marital status: Married    Spouse name: Not on file  . Number of children: Not on file  . Years of education: Not on file  . Highest education level: Not on file  Occupational History  . Not on file  Tobacco Use  . Smoking status: Never Smoker  . Smokeless tobacco: Never Used  Vaping Use  . Vaping Use: Never used  Substance  and Sexual Activity  . Alcohol use: Yes    Alcohol/week: 2.0 standard drinks    Types: 2 Glasses of wine per week  . Drug use: Never  . Sexual activity: Yes    Birth control/protection: Pill  Other Topics Concern  . Not on file  Social History Narrative  . Not on file   Social Determinants of Health   Financial Resource Strain: Not on file  Food Insecurity: Not on file  Transportation Needs: Not on file  Physical Activity: Not on file  Stress: Not on file  Social Connections: Not on file  Intimate Partner Violence: Not on file    Objective:   Today's Vitals: BP 122/72   Pulse 70   Ht 5\' 4"  (1.626 m)   Wt 127 lb 12.8 oz (58 kg)   SpO2 100%   BMI 21.94 kg/m   Physical Exam  Pleasant 40 year old female in no acute distress Cardiovascular exam with regular rate and rhythm, no murmurs appreciated Lungs clear to auscultation bilaterally Bilateral distal lower extremities: Mild tenderness to palpation medially and slightly posteriorly to tibias, right worse than left.  Tenderness to palpation is over diffuse area without discrete focal tenderness and with no bony tenderness.  Tenderness to palpation extends along posterior tibialis tendon bilaterally.  Normal strength with ankle dorsiflexion, plantarflexion, inversion and eversion.  No significant pain with tandem calf raises or  unilateral calf raises.  Assessment & Plan:   Problem List Items Addressed This Visit      Endocrine   Thyroid nodule    Will refer to endocrinology for evaluation of follow-up      Relevant Orders   Ambulatory referral to Endocrinology     Musculoskeletal and Integument   Posterior tibial tendinitis of right lower extremity    Discussed diagnosis, treatment options and prognosis Ideally, would allow for period of relative rest and rehab, however given that Race is less than 1 month away it will be difficult to provide adequate rest Recommend completing some crosstraining to allow for  maintenance of aerobic fitness while reducing symptoms Will refer to physical therapy for further evaluation and management, home exercise program as per PT Plan for follow-up shortly after race to monitor progress, adjust treatment plan as needed      Relevant Orders   Ambulatory referral to Physical Therapy     Other   Medial tibial stress syndrome    Ideally would allow for relative rest and rehab Plan and treatment similar to above      Relevant Orders   Ambulatory referral to Physical Therapy      Outpatient Encounter Medications as of 11/11/2020  Medication Sig  . APRI 0.15-30 MG-MCG tablet Take 1 tablet by mouth daily.  . [DISCONTINUED] meloxicam (MOBIC) 15 MG tablet Take 1 tablet (15 mg total) by mouth daily.  . [DISCONTINUED] methylPREDNISolone (MEDROL DOSEPAK) 4 MG TBPK tablet 6 day dose pack - take as directed   No facility-administered encounter medications on file as of 11/11/2020.    Follow-up: Return if symptoms worsen or fail to improve.   Ramez Arrona J De [place], MD

## 2020-11-26 DIAGNOSIS — M62830 Muscle spasm of back: Secondary | ICD-10-CM | POA: Diagnosis not present

## 2020-11-26 DIAGNOSIS — M9903 Segmental and somatic dysfunction of lumbar region: Secondary | ICD-10-CM | POA: Diagnosis not present

## 2020-11-26 DIAGNOSIS — M5416 Radiculopathy, lumbar region: Secondary | ICD-10-CM | POA: Diagnosis not present

## 2020-11-26 DIAGNOSIS — M9905 Segmental and somatic dysfunction of pelvic region: Secondary | ICD-10-CM | POA: Diagnosis not present

## 2020-12-02 ENCOUNTER — Encounter: Payer: Self-pay | Admitting: Podiatry

## 2020-12-02 ENCOUNTER — Other Ambulatory Visit: Payer: Self-pay

## 2020-12-02 ENCOUNTER — Ambulatory Visit: Payer: BC Managed Care – PPO | Admitting: Podiatry

## 2020-12-02 DIAGNOSIS — M76821 Posterior tibial tendinitis, right leg: Secondary | ICD-10-CM | POA: Diagnosis not present

## 2020-12-02 DIAGNOSIS — M722 Plantar fascial fibromatosis: Secondary | ICD-10-CM

## 2020-12-02 NOTE — Progress Notes (Signed)
  Subjective:  Patient ID: Carrie Taylor, female    DOB: 11-09-1980,  MRN: [id number]  Chief Complaint  Patient presents with  . Plantar Fasciitis      PF AND ORTHOTIC PICK UP    40 y.o. female returns for follow-up with the above complaint. History confirmed with patient.  Overall she is doing very well and still sore some what when she is running but it has been.  She travels to [place] Saturday for a really raise Objective:  Physical Exam: warm, good capillary refill, no trophic changes or ulcerative lesions, normal DP and PT pulses and normal sensory exam.  Still has some pain in the plantar medial fascia, some along the posterior tibial tendon, and most of the tibial shinsplints have resolved.  Worse on the right than the left  Radiographs: X-ray of the right foot: no fracture, dislocation, swelling or degenerative changes noted Assessment:   1. Plantar fasciitis   2. Posterior tibial tendinitis of right lower extremity      Plan:  Patient was evaluated and treated and all questions answered.  Overall she had quite a bit of improvement.  I still think most of this is a sequela of the draining she has been doing for the relay race.  Should improve significantly after being on the rest after the race next week.  I dispensed her custom orthotics today, they fit well for for fit and function.  I reviewed the break-in period.  Return as needed if orthotics need adjusting or do not resolve   Return if symptoms worsen or fail to improve.

## 2020-12-03 DIAGNOSIS — M5416 Radiculopathy, lumbar region: Secondary | ICD-10-CM | POA: Diagnosis not present

## 2020-12-03 DIAGNOSIS — M62830 Muscle spasm of back: Secondary | ICD-10-CM | POA: Diagnosis not present

## 2020-12-03 DIAGNOSIS — M9905 Segmental and somatic dysfunction of pelvic region: Secondary | ICD-10-CM | POA: Diagnosis not present

## 2020-12-03 DIAGNOSIS — M9903 Segmental and somatic dysfunction of lumbar region: Secondary | ICD-10-CM | POA: Diagnosis not present

## 2020-12-23 DIAGNOSIS — M5416 Radiculopathy, lumbar region: Secondary | ICD-10-CM | POA: Diagnosis not present

## 2020-12-23 DIAGNOSIS — M9905 Segmental and somatic dysfunction of pelvic region: Secondary | ICD-10-CM | POA: Diagnosis not present

## 2020-12-23 DIAGNOSIS — M9903 Segmental and somatic dysfunction of lumbar region: Secondary | ICD-10-CM | POA: Diagnosis not present

## 2020-12-23 DIAGNOSIS — M62830 Muscle spasm of back: Secondary | ICD-10-CM | POA: Diagnosis not present

## 2021-01-13 DIAGNOSIS — M9905 Segmental and somatic dysfunction of pelvic region: Secondary | ICD-10-CM | POA: Diagnosis not present

## 2021-01-13 DIAGNOSIS — M9903 Segmental and somatic dysfunction of lumbar region: Secondary | ICD-10-CM | POA: Diagnosis not present

## 2021-01-13 DIAGNOSIS — M5416 Radiculopathy, lumbar region: Secondary | ICD-10-CM | POA: Diagnosis not present

## 2021-01-13 DIAGNOSIS — M62830 Muscle spasm of back: Secondary | ICD-10-CM | POA: Diagnosis not present

## 2021-01-20 DIAGNOSIS — Z0189 Encounter for other specified special examinations: Secondary | ICD-10-CM | POA: Diagnosis not present

## 2021-01-21 DIAGNOSIS — Z713 Dietary counseling and surveillance: Secondary | ICD-10-CM | POA: Diagnosis not present

## 2021-01-21 DIAGNOSIS — Z043 Encounter for examination and observation following other accident: Secondary | ICD-10-CM | POA: Diagnosis not present

## 2021-01-28 ENCOUNTER — Ambulatory Visit: Payer: BC Managed Care – PPO | Admitting: Endocrinology

## 2021-01-28 ENCOUNTER — Encounter: Payer: Self-pay | Admitting: Endocrinology

## 2021-01-28 ENCOUNTER — Other Ambulatory Visit: Payer: Self-pay

## 2021-01-28 VITALS — BP 112/74 | HR 74 | Ht 64.0 in | Wt 126.0 lb

## 2021-01-28 DIAGNOSIS — E041 Nontoxic single thyroid nodule: Secondary | ICD-10-CM

## 2021-01-28 NOTE — Patient Instructions (Signed)
Let's recheck the ultrasound.  you will receive a phone call, about a day and time for an appointment.

## 2021-01-28 NOTE — Progress Notes (Signed)
   Subjective:    Patient ID: Carrie Taylor, female    DOB: 1980/08/24, 40 y.o.   MRN: [id number]  HPI Pt is referred by Dr Ihor Dow, for nodular thyroid.  Pt was noted to have a thyroid nodule in in 2009, when she lived in [place].  She does not recall the name of the facility.  she has no h/o XRT or surgery to the neck.  Pt says she had [place] at a doctor's office in [place], most recently in 2011.   Past Medical History:  Diagnosis Date   Thyroid disease     No past surgical history on file.  Social History   Socioeconomic History   Marital status: Married    Spouse name: Not on file   Number of children: Not on file   Years of education: Not on file   Highest education level: Not on file  Occupational History   Not on file  Tobacco Use   Smoking status: Never   Smokeless tobacco: Never  Vaping Use   Vaping Use: Never used  Substance and Sexual Activity   Alcohol use: Yes    Alcohol/week: 2.0 standard drinks    Types: 2 Glasses of wine per week   Drug use: Never   Sexual activity: Yes    Birth control/protection: Pill  Other Topics Concern   Not on file  Social History Narrative   Not on file   Social Determinants of Health   Financial Resource Strain: Not on file  Food Insecurity: Not on file  Transportation Needs: Not on file  Physical Activity: Not on file  Stress: Not on file  Social Connections: Not on file  Intimate Partner Violence: Not on file    Current Outpatient Medications on File Prior to Visit  Medication Sig Dispense Refill   APRI 0.15-30 MG-MCG tablet Take 1 tablet by mouth daily.     No current facility-administered medications on file prior to visit.    Allergies  Allergen Reactions   Penicillins Anaphylaxis    Family History  Problem Relation Age of Onset   Stroke Paternal Grandmother    Colon cancer Paternal Grandmother    Thyroid disease Neg Hx     BP 112/74 (BP Location: Right Arm, Patient Position: Sitting, Cuff Size: Normal)    Pulse 74   Ht 5\' 4"  (1.626 m)   Wt 126 lb (57.2 kg)   SpO2 98%   BMI 21.63 kg/m    Review of Systems Denies hoarseness, neck pain, sob.    Objective:   Physical Exam NECK: There is no palpable thyroid enlargement.  No thyroid nodule is palpable.  No palpable lymphadenopathy at the anterior neck.   outside test results are reviewed: TSH=2.4  I have reviewed outside records, and summarized: Pt was noted to have elevated MNG, and referred here.  Pt reported MSK sxs.  She was training for a long distance race    Assessment & Plan:  MNG, new to me, uncertain etiology and prognosis.    Patient Instructions  Let's recheck the ultrasound.  you will receive a phone call, about a day and time for an appointment.

## 2021-02-04 DIAGNOSIS — M9905 Segmental and somatic dysfunction of pelvic region: Secondary | ICD-10-CM | POA: Diagnosis not present

## 2021-02-04 DIAGNOSIS — M5416 Radiculopathy, lumbar region: Secondary | ICD-10-CM | POA: Diagnosis not present

## 2021-02-04 DIAGNOSIS — M9903 Segmental and somatic dysfunction of lumbar region: Secondary | ICD-10-CM | POA: Diagnosis not present

## 2021-02-04 DIAGNOSIS — M62830 Muscle spasm of back: Secondary | ICD-10-CM | POA: Diagnosis not present

## 2021-02-17 ENCOUNTER — Ambulatory Visit
Admission: RE | Admit: 2021-02-17 | Discharge: 2021-02-17 | Disposition: A | Discharge status: Home / Self Care | Payer: BC Managed Care – PPO | Source: Ambulatory Visit | Attending: Endocrinology | Admitting: Endocrinology

## 2021-02-17 ENCOUNTER — Other Ambulatory Visit: Payer: Self-pay

## 2021-02-17 DIAGNOSIS — E041 Nontoxic single thyroid nodule: Secondary | ICD-10-CM | POA: Diagnosis not present

## 2021-03-04 DIAGNOSIS — Z1231 Encounter for screening mammogram for malignant neoplasm of breast: Secondary | ICD-10-CM | POA: Diagnosis not present

## 2021-03-04 DIAGNOSIS — M9903 Segmental and somatic dysfunction of lumbar region: Secondary | ICD-10-CM | POA: Diagnosis not present

## 2021-03-04 DIAGNOSIS — M5416 Radiculopathy, lumbar region: Secondary | ICD-10-CM | POA: Diagnosis not present

## 2021-03-04 DIAGNOSIS — M62830 Muscle spasm of back: Secondary | ICD-10-CM | POA: Diagnosis not present

## 2021-03-04 DIAGNOSIS — M9905 Segmental and somatic dysfunction of pelvic region: Secondary | ICD-10-CM | POA: Diagnosis not present

## 2021-04-08 DIAGNOSIS — M5416 Radiculopathy, lumbar region: Secondary | ICD-10-CM | POA: Diagnosis not present

## 2021-04-08 DIAGNOSIS — M62830 Muscle spasm of back: Secondary | ICD-10-CM | POA: Diagnosis not present

## 2021-04-08 DIAGNOSIS — M9903 Segmental and somatic dysfunction of lumbar region: Secondary | ICD-10-CM | POA: Diagnosis not present

## 2021-04-08 DIAGNOSIS — M9905 Segmental and somatic dysfunction of pelvic region: Secondary | ICD-10-CM | POA: Diagnosis not present

## 2021-04-14 DIAGNOSIS — Z23 Encounter for immunization: Secondary | ICD-10-CM | POA: Diagnosis not present

## 2021-05-13 DIAGNOSIS — M62830 Muscle spasm of back: Secondary | ICD-10-CM | POA: Diagnosis not present

## 2021-05-13 DIAGNOSIS — M9903 Segmental and somatic dysfunction of lumbar region: Secondary | ICD-10-CM | POA: Diagnosis not present

## 2021-05-13 DIAGNOSIS — M9905 Segmental and somatic dysfunction of pelvic region: Secondary | ICD-10-CM | POA: Diagnosis not present

## 2021-05-13 DIAGNOSIS — M5416 Radiculopathy, lumbar region: Secondary | ICD-10-CM | POA: Diagnosis not present

## 2022-01-11 DIAGNOSIS — Z0189 Encounter for other specified special examinations: Secondary | ICD-10-CM | POA: Diagnosis not present

## 2022-01-13 DIAGNOSIS — Z043 Encounter for examination and observation following other accident: Secondary | ICD-10-CM | POA: Diagnosis not present

## 2022-01-13 DIAGNOSIS — Z713 Dietary counseling and surveillance: Secondary | ICD-10-CM | POA: Diagnosis not present

## 2022-03-31 DIAGNOSIS — Z1151 Encounter for screening for human papillomavirus (HPV): Secondary | ICD-10-CM | POA: Diagnosis not present

## 2022-03-31 DIAGNOSIS — Z1231 Encounter for screening mammogram for malignant neoplasm of breast: Secondary | ICD-10-CM | POA: Diagnosis not present

## 2022-03-31 DIAGNOSIS — Z01419 Encounter for gynecological examination (general) (routine) without abnormal findings: Secondary | ICD-10-CM | POA: Diagnosis not present

## 2022-03-31 DIAGNOSIS — Z6821 Body mass index (BMI) 21.0-21.9, adult: Secondary | ICD-10-CM | POA: Diagnosis not present

## 2022-03-31 DIAGNOSIS — Z124 Encounter for screening for malignant neoplasm of cervix: Secondary | ICD-10-CM | POA: Diagnosis not present

## 2022-04-25 DIAGNOSIS — Z3202 Encounter for pregnancy test, result negative: Secondary | ICD-10-CM | POA: Diagnosis not present

## 2022-04-25 DIAGNOSIS — N870 Mild cervical dysplasia: Secondary | ICD-10-CM | POA: Diagnosis not present

## 2022-04-25 DIAGNOSIS — R87610 Atypical squamous cells of undetermined significance on cytologic smear of cervix (ASC-US): Secondary | ICD-10-CM | POA: Diagnosis not present

## 2022-10-18 DIAGNOSIS — M9903 Segmental and somatic dysfunction of lumbar region: Secondary | ICD-10-CM | POA: Diagnosis not present

## 2022-10-18 DIAGNOSIS — M62830 Muscle spasm of back: Secondary | ICD-10-CM | POA: Diagnosis not present

## 2022-10-18 DIAGNOSIS — M9905 Segmental and somatic dysfunction of pelvic region: Secondary | ICD-10-CM | POA: Diagnosis not present

## 2022-10-18 DIAGNOSIS — M5416 Radiculopathy, lumbar region: Secondary | ICD-10-CM | POA: Diagnosis not present

## 2022-12-06 DIAGNOSIS — M5416 Radiculopathy, lumbar region: Secondary | ICD-10-CM | POA: Diagnosis not present

## 2022-12-06 DIAGNOSIS — M9903 Segmental and somatic dysfunction of lumbar region: Secondary | ICD-10-CM | POA: Diagnosis not present

## 2022-12-06 DIAGNOSIS — M62830 Muscle spasm of back: Secondary | ICD-10-CM | POA: Diagnosis not present

## 2022-12-06 DIAGNOSIS — M9905 Segmental and somatic dysfunction of pelvic region: Secondary | ICD-10-CM | POA: Diagnosis not present

## 2023-01-03 DIAGNOSIS — M9903 Segmental and somatic dysfunction of lumbar region: Secondary | ICD-10-CM | POA: Diagnosis not present

## 2023-01-03 DIAGNOSIS — M9905 Segmental and somatic dysfunction of pelvic region: Secondary | ICD-10-CM | POA: Diagnosis not present

## 2023-01-03 DIAGNOSIS — M5416 Radiculopathy, lumbar region: Secondary | ICD-10-CM | POA: Diagnosis not present

## 2023-01-03 DIAGNOSIS — M62830 Muscle spasm of back: Secondary | ICD-10-CM | POA: Diagnosis not present

## 2023-04-25 DIAGNOSIS — M62830 Muscle spasm of back: Secondary | ICD-10-CM | POA: Diagnosis not present

## 2023-04-25 DIAGNOSIS — M9903 Segmental and somatic dysfunction of lumbar region: Secondary | ICD-10-CM | POA: Diagnosis not present

## 2023-04-25 DIAGNOSIS — M9905 Segmental and somatic dysfunction of pelvic region: Secondary | ICD-10-CM | POA: Diagnosis not present

## 2023-04-25 DIAGNOSIS — M5416 Radiculopathy, lumbar region: Secondary | ICD-10-CM | POA: Diagnosis not present

## 2023-05-18 IMAGING — US US THYROID
1 series · 14 of 25 positions shown · non-contrast
Comparison: None.

CLINICAL DATA: Thyroid nodule

EXAM:
THYROID ULTRASOUND
TECHNIQUE: Ultrasound examination of the thyroid gland and adjacent soft
tissues was performed.

[Series 1: us thyroid · 14 of 41 slices shown]
[im 1/41]
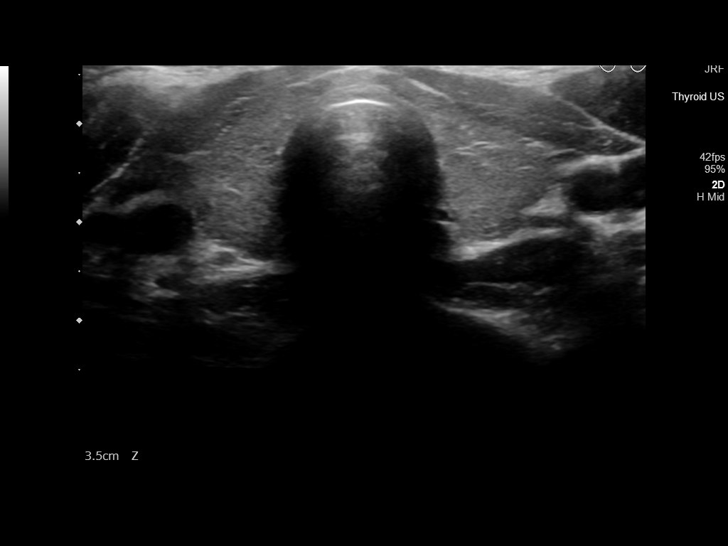
[im 4/41]
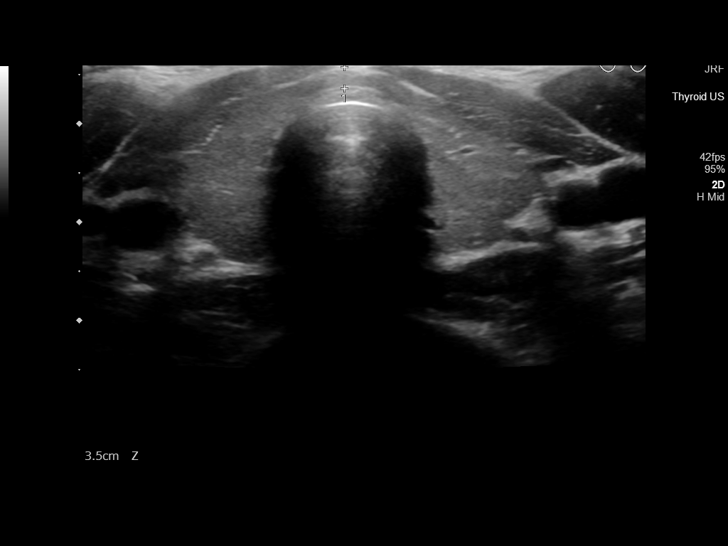
[im 7/41]
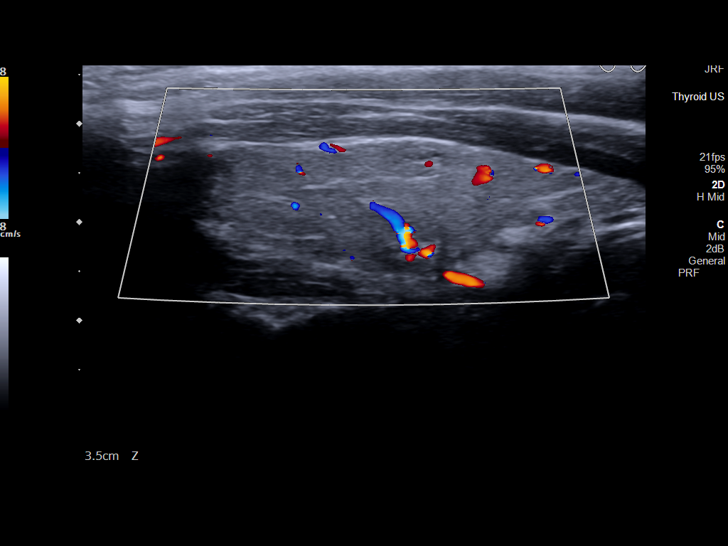
[im 11/41]
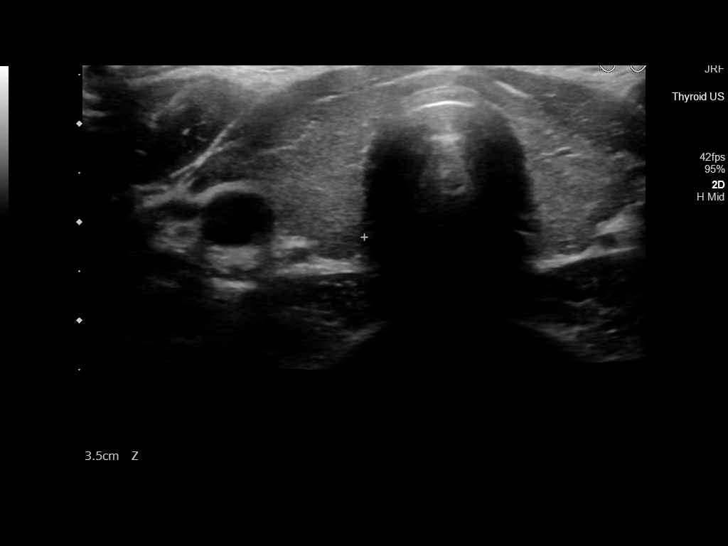
[im 14/41]
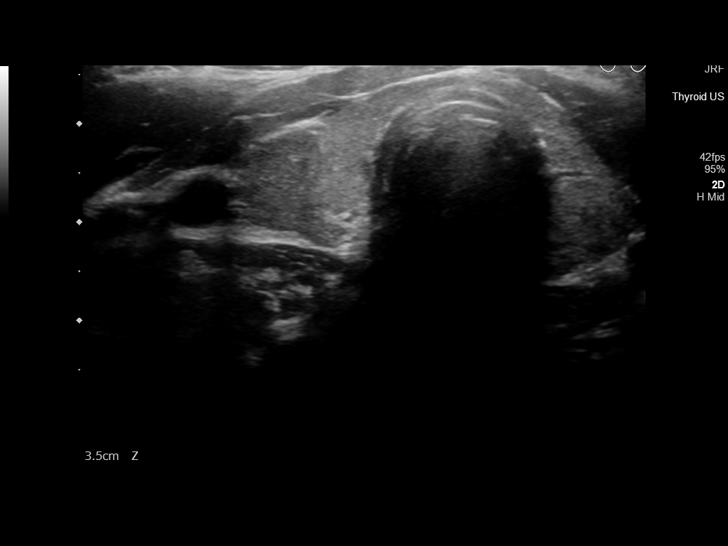
[im 16/41]
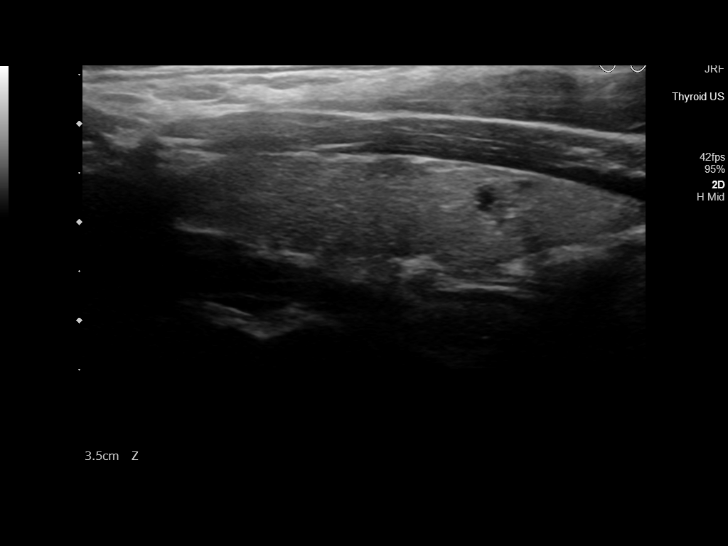
[im 19/41]
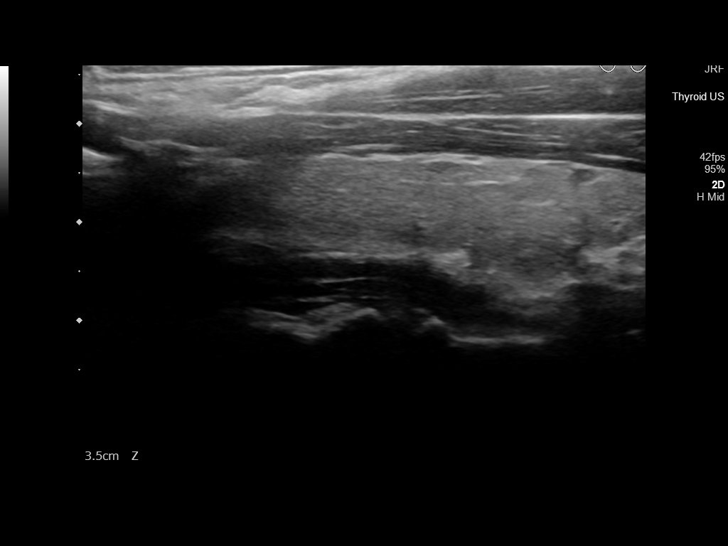
[im 22/41]
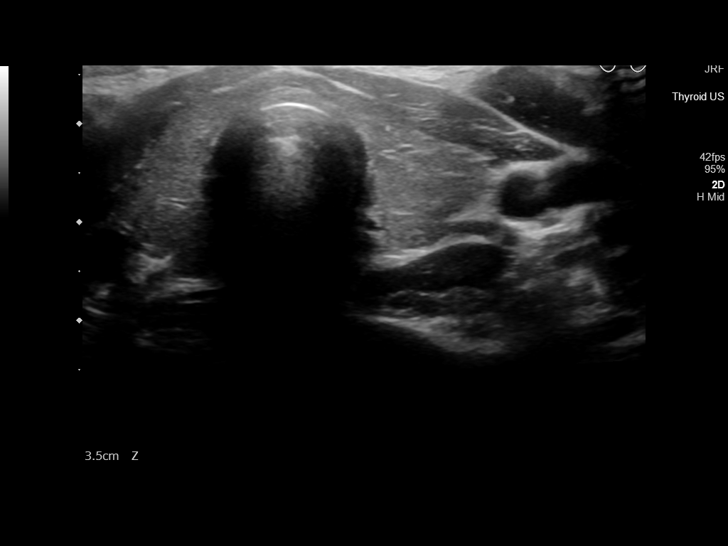
[im 26/41]
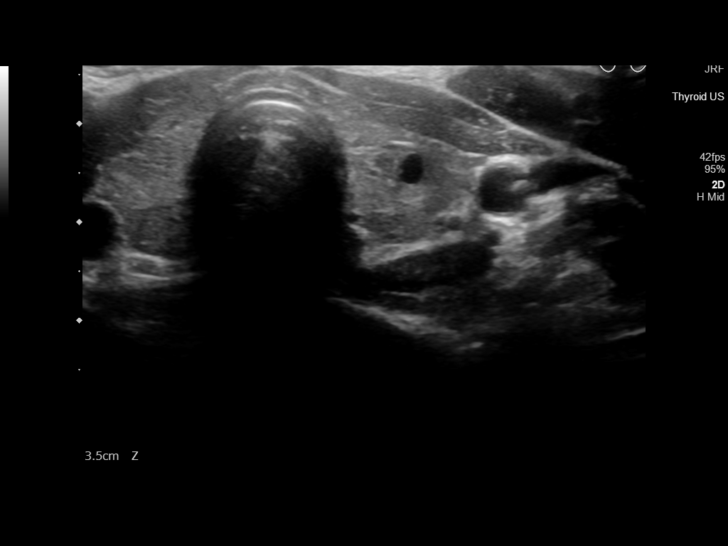
[im 27/41]
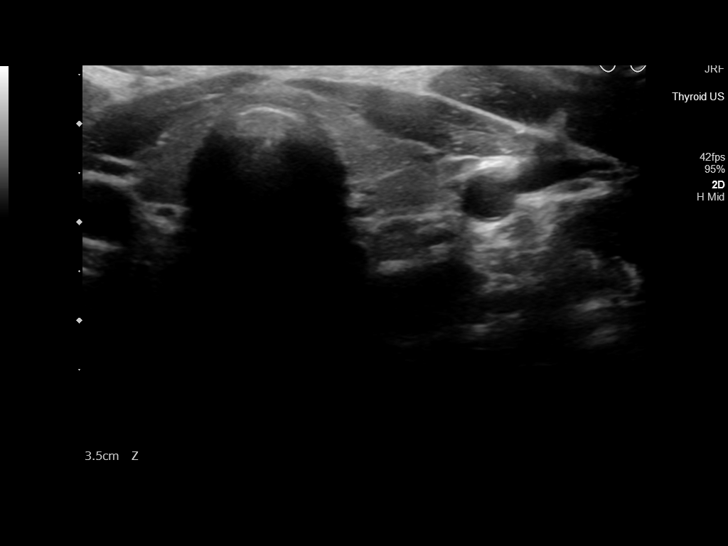
[im 31/41]
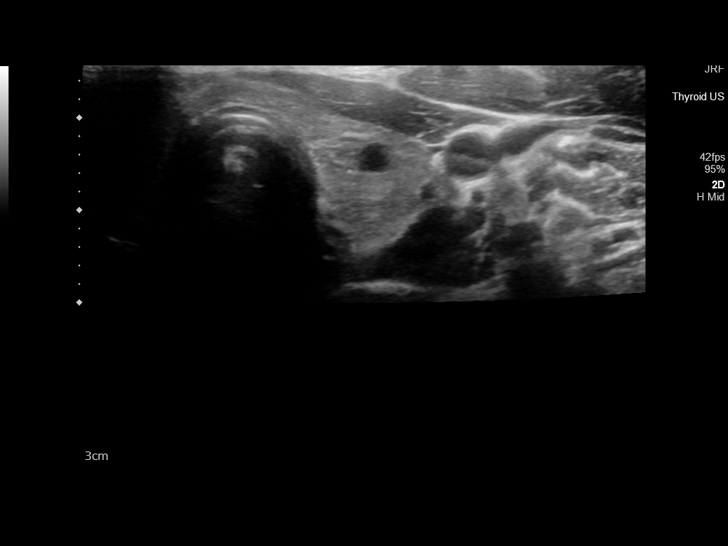
[im 34/41]
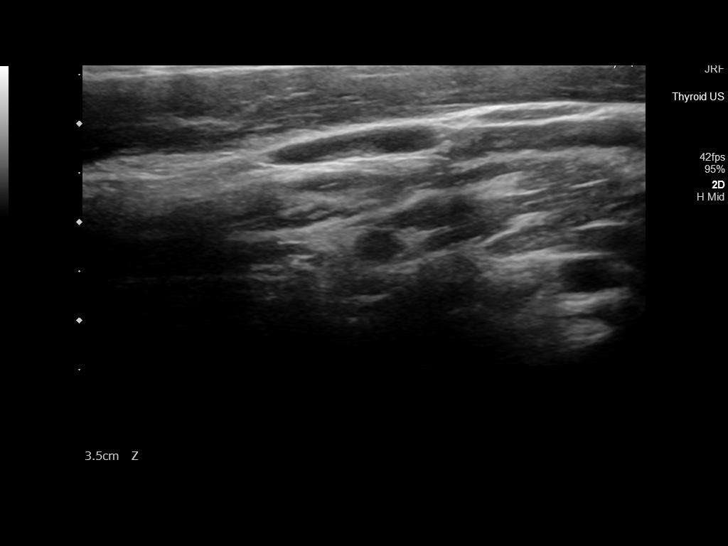
[im 37/41]
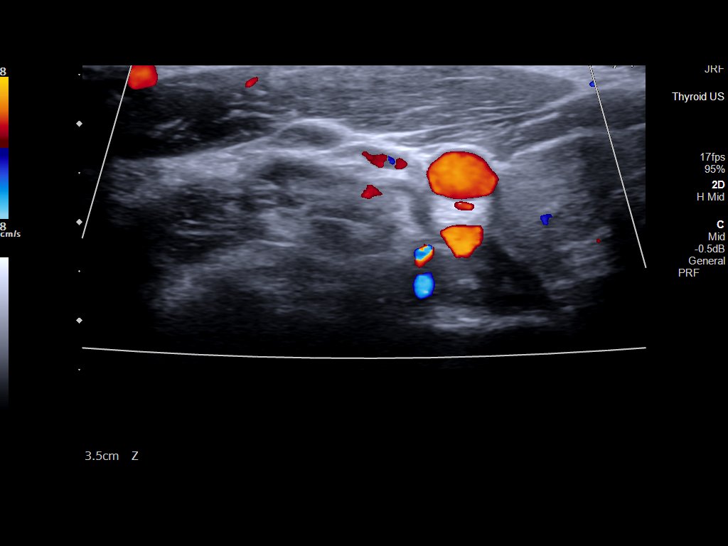
[im 41/41]
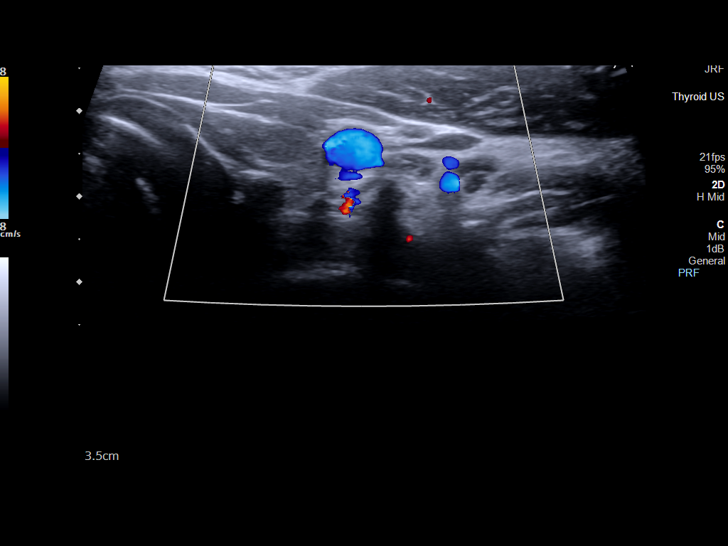

[14 of 25 positions shown; findings below may reference images not displayed]

FINDINGS: Parenchymal Echotexture: Mildly heterogeneous

Isthmus: 0.2 cm

Right lobe: 4.5 x 1.4 x 1.3 cm

Left lobe: 4.8 x 1.2 x 1.2 cm

_________________________________________________________

Estimated total number of nodules >/= 1 cm: 0

Number of spongiform nodules >/=  2 cm not described below (TR1): 0

Number of mixed cystic and solid nodules >/= 1.5 cm not described
below (TR2): 0

_________________________________________________________

4 mm cystic nodule in the inferior left thyroid lobe does not meet
criteria for FNA or surveillance.
IMPRESSION: No significant sonographic abnormality of the thyroid.

The above is in keeping with the ACR TI-RADS recommendations - [HOSPITAL] 2377;[DATE].

## 2023-06-06 DIAGNOSIS — Z6823 Body mass index (BMI) 23.0-23.9, adult: Secondary | ICD-10-CM | POA: Diagnosis not present

## 2023-06-06 DIAGNOSIS — Z01419 Encounter for gynecological examination (general) (routine) without abnormal findings: Secondary | ICD-10-CM | POA: Diagnosis not present

## 2023-06-06 DIAGNOSIS — Z1231 Encounter for screening mammogram for malignant neoplasm of breast: Secondary | ICD-10-CM | POA: Diagnosis not present
